# Patient Record
Sex: Female | Born: 1985 | Race: Black or African American | Hispanic: No
Health system: Southern US, Community
[De-identification: ages and names within clinical notes are randomized; demographics above are authoritative.]

---

## 2018-05-15 ENCOUNTER — Emergency Department: Payer: Self-pay

## 2018-05-15 ENCOUNTER — Emergency Department
Admission: EM | Admit: 2018-05-15 | Discharge: 2018-05-15 | Payer: Self-pay | Attending: Emergency Medicine | Admitting: Emergency Medicine

## 2018-05-15 ENCOUNTER — Encounter: Payer: Self-pay | Admitting: Emergency Medicine

## 2018-05-15 ENCOUNTER — Other Ambulatory Visit: Payer: Self-pay

## 2018-05-15 DIAGNOSIS — Y999 Unspecified external cause status: Secondary | ICD-10-CM | POA: Insufficient documentation

## 2018-05-15 DIAGNOSIS — Y9389 Activity, other specified: Secondary | ICD-10-CM | POA: Insufficient documentation

## 2018-05-15 DIAGNOSIS — Y92410 Unspecified street and highway as the place of occurrence of the external cause: Secondary | ICD-10-CM | POA: Insufficient documentation

## 2018-05-15 DIAGNOSIS — R51 Headache: Secondary | ICD-10-CM | POA: Insufficient documentation

## 2018-05-15 LAB — ETHANOL: Alcohol, Ethyl (B): 194 mg/dL — ABNORMAL HIGH (ref <10)
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
## 2018-05-15 NOTE — ED Notes (Signed)
Patient transported to CT per myself and and [employer] officer.
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
